# Patient Record
Sex: Male | Born: 1980 | Race: Asian | Hispanic: No | Marital: Married | Smoking: Current every day smoker
Health system: Southern US, Community
[De-identification: ages and names within clinical notes are randomized; demographics above are authoritative.]

## PROBLEM LIST (undated history)

## (undated) DIAGNOSIS — F1010 Alcohol abuse, uncomplicated: Secondary | ICD-10-CM

## (undated) DIAGNOSIS — Z789 Other specified health status: Secondary | ICD-10-CM

---

## 2015-10-20 ENCOUNTER — Inpatient Hospital Stay
Admission: EM | Admit: 2015-10-20 | Discharge: 2015-10-22 | DRG: 439 | Disposition: A | Discharge status: Home / Self Care | Payer: Medicaid Other | Attending: Internal Medicine | Admitting: Internal Medicine

## 2015-10-20 ENCOUNTER — Encounter: Payer: Self-pay | Admitting: Emergency Medicine

## 2015-10-20 ENCOUNTER — Emergency Department: Payer: Medicaid Other

## 2015-10-20 DIAGNOSIS — F10920 Alcohol use, unspecified with intoxication, uncomplicated: Secondary | ICD-10-CM

## 2015-10-20 DIAGNOSIS — K852 Alcohol induced acute pancreatitis without necrosis or infection: Principal | ICD-10-CM | POA: Diagnosis present

## 2015-10-20 DIAGNOSIS — K859 Acute pancreatitis without necrosis or infection, unspecified: Secondary | ICD-10-CM | POA: Diagnosis present

## 2015-10-20 DIAGNOSIS — Y908 Blood alcohol level of 240 mg/100 ml or more: Secondary | ICD-10-CM | POA: Diagnosis present

## 2015-10-20 DIAGNOSIS — E876 Hypokalemia: Secondary | ICD-10-CM | POA: Diagnosis present

## 2015-10-20 DIAGNOSIS — F1020 Alcohol dependence, uncomplicated: Secondary | ICD-10-CM

## 2015-10-20 DIAGNOSIS — R52 Pain, unspecified: Secondary | ICD-10-CM

## 2015-10-20 DIAGNOSIS — F10288 Alcohol dependence with other alcohol-induced disorder: Secondary | ICD-10-CM | POA: Diagnosis present

## 2015-10-20 DIAGNOSIS — F1029 Alcohol dependence with unspecified alcohol-induced disorder: Secondary | ICD-10-CM

## 2015-10-20 DIAGNOSIS — F17210 Nicotine dependence, cigarettes, uncomplicated: Secondary | ICD-10-CM | POA: Diagnosis present

## 2015-10-20 DIAGNOSIS — R1110 Vomiting, unspecified: Secondary | ICD-10-CM

## 2015-10-20 DIAGNOSIS — R1013 Epigastric pain: Secondary | ICD-10-CM | POA: Diagnosis present

## 2015-10-20 DIAGNOSIS — F10229 Alcohol dependence with intoxication, unspecified: Secondary | ICD-10-CM | POA: Diagnosis present

## 2015-10-20 HISTORY — DX: Alcohol abuse, uncomplicated: F10.10

## 2015-10-20 HISTORY — DX: Other specified health status: Z78.9

## 2015-10-20 LAB — COMPREHENSIVE METABOLIC PANEL
ALT: 35 U/L (ref 17–63)
ANION GAP: 12 (ref 5–15)
AST: 124 U/L — ABNORMAL HIGH (ref 15–41)
Albumin: 3.7 g/dL (ref 3.5–5.0)
Alkaline Phosphatase: 81 U/L (ref 38–126)
BUN: 8 mg/dL (ref 6–20)
CHLORIDE: 103 mmol/L (ref 101–111)
CO2: 25 mmol/L (ref 22–32)
CREATININE: 0.81 mg/dL (ref 0.61–1.24)
Calcium: 8.2 mg/dL — ABNORMAL LOW (ref 8.9–10.3)
Glucose, Bld: 103 mg/dL — ABNORMAL HIGH (ref 65–99)
POTASSIUM: 3 mmol/L — AB (ref 3.5–5.1)
SODIUM: 140 mmol/L (ref 135–145)
Total Bilirubin: 0.4 mg/dL (ref 0.3–1.2)
Total Protein: 8 g/dL (ref 6.5–8.1)

## 2015-10-20 LAB — CBC WITH DIFFERENTIAL/PLATELET
Basophils Absolute: 0 10*3/uL (ref 0–0.1)
Basophils Relative: 0 %
EOS ABS: 0.1 10*3/uL (ref 0–0.7)
EOS PCT: 2 %
HCT: 40.4 % (ref 40.0–52.0)
Hemoglobin: 13.5 g/dL (ref 13.0–18.0)
LYMPHS ABS: 2.3 10*3/uL (ref 1.0–3.6)
LYMPHS PCT: 39 %
MCH: 30.5 pg (ref 26.0–34.0)
MCHC: 33.5 g/dL (ref 32.0–36.0)
MCV: 91.1 fL (ref 80.0–100.0)
MONO ABS: 0.6 10*3/uL (ref 0.2–1.0)
MONOS PCT: 10 %
Neutro Abs: 2.8 10*3/uL (ref 1.4–6.5)
Neutrophils Relative %: 49 %
PLATELETS: 156 10*3/uL (ref 150–440)
RBC: 4.43 MIL/uL (ref 4.40–5.90)
RDW: 16.8 % — AB (ref 11.5–14.5)
WBC: 5.8 10*3/uL (ref 3.8–10.6)

## 2015-10-20 LAB — LIPASE, BLOOD: LIPASE: 227 U/L — AB (ref 11–51)

## 2015-10-20 LAB — MAGNESIUM: MAGNESIUM: 1.6 mg/dL — AB (ref 1.7–2.4)

## 2015-10-20 LAB — ETHANOL: ALCOHOL ETHYL (B): 419 mg/dL — AB (ref <5)

## 2015-10-20 MED ORDER — ONDANSETRON HCL 4 MG/2ML IJ SOLN: 4.0000 mg | INTRAMUSCULAR | Status: AC

## 2015-10-20 MED ORDER — HYDRALAZINE HCL 20 MG/ML IJ SOLN
10.0000 mg | Freq: Four times a day (QID) | INTRAMUSCULAR | Status: DC | PRN
  Administered 2015-10-20: 10 mg via INTRAVENOUS

## 2015-10-20 MED ORDER — ADULT MULTIVITAMIN W/MINERALS CH
1.0000 | ORAL_TABLET | Freq: Every day | ORAL | Status: DC
  Administered 2015-10-21 – 2015-10-22 (×2): 1 via ORAL
  Filled 2015-10-20 (×2): qty 1

## 2015-10-20 MED ORDER — CLONIDINE HCL 0.1 MG PO TABS: 0.1000 mg | ORAL_TABLET | Freq: Four times a day (QID) | ORAL | Status: DC | PRN

## 2015-10-20 MED ORDER — MAGNESIUM SULFATE 2 GM/50ML IV SOLN
2.0000 g | Freq: Once | INTRAVENOUS | Status: AC
  Administered 2015-10-20: 2 g via INTRAVENOUS
  Filled 2015-10-20: qty 50

## 2015-10-20 MED ORDER — POTASSIUM CHLORIDE CRYS ER 20 MEQ PO TBCR: 40.0000 meq | EXTENDED_RELEASE_TABLET | Freq: Once | ORAL | Status: DC

## 2015-10-20 MED ORDER — MORPHINE SULFATE (PF) 4 MG/ML IV SOLN: 4.0000 mg | Freq: Once | INTRAVENOUS | Status: DC

## 2015-10-20 MED ORDER — MORPHINE SULFATE (PF) 4 MG/ML IV SOLN: 4.0000 mg | INTRAVENOUS | Status: DC | PRN

## 2015-10-20 MED ORDER — DIAZEPAM 5 MG PO TABS
ORAL_TABLET | ORAL | Status: AC
  Filled 2015-10-20: qty 1

## 2015-10-20 MED ORDER — POTASSIUM CHLORIDE IN NACL 40-0.9 MEQ/L-% IV SOLN
INTRAVENOUS | Status: AC
  Administered 2015-10-20: 125 mL/h via INTRAVENOUS
  Filled 2015-10-20: qty 1000

## 2015-10-20 MED ORDER — ONDANSETRON HCL 4 MG/2ML IJ SOLN: 4.0000 mg | Freq: Four times a day (QID) | INTRAMUSCULAR | Status: DC | PRN

## 2015-10-20 MED ORDER — ENOXAPARIN SODIUM 40 MG/0.4ML ~~LOC~~ SOLN
40.0000 mg | SUBCUTANEOUS | Status: DC
  Administered 2015-10-20 – 2015-10-21 (×2): 40 mg via SUBCUTANEOUS
  Filled 2015-10-20 (×2): qty 0.4

## 2015-10-20 MED ORDER — LORAZEPAM 2 MG/ML IJ SOLN: 2.0000 mg | INTRAMUSCULAR | Status: DC | PRN

## 2015-10-20 MED ORDER — THIAMINE HCL 100 MG/ML IJ SOLN: 100.0000 mg | Freq: Every day | INTRAMUSCULAR | Status: DC

## 2015-10-20 MED ORDER — MORPHINE SULFATE (PF) 2 MG/ML IV SOLN: 2.0000 mg | INTRAVENOUS | Status: DC | PRN

## 2015-10-20 MED ORDER — POTASSIUM CHLORIDE 10 MEQ/100ML IV SOLN
10.0000 meq | INTRAVENOUS | Status: AC
Start: 2015-10-20 — End: 2015-10-20
  Administered 2015-10-20 (×5): 10 meq via INTRAVENOUS
  Filled 2015-10-20 (×6): qty 100

## 2015-10-20 MED ORDER — VITAMIN B-1 100 MG PO TABS
100.0000 mg | ORAL_TABLET | Freq: Every day | ORAL | Status: DC
  Administered 2015-10-21 – 2015-10-22 (×2): 100 mg via ORAL
  Filled 2015-10-20 (×2): qty 1

## 2015-10-20 MED ORDER — MAGNESIUM SULFATE 4 GM/100ML IV SOLN
4.0000 g | Freq: Once | INTRAVENOUS | Status: DC
  Filled 2015-10-20: qty 100

## 2015-10-20 MED ORDER — HYDRALAZINE HCL 20 MG/ML IJ SOLN
INTRAMUSCULAR | Status: AC
  Filled 2015-10-20: qty 1

## 2015-10-20 MED ORDER — FOLIC ACID 1 MG PO TABS
1.0000 mg | ORAL_TABLET | Freq: Every day | ORAL | Status: DC
  Administered 2015-10-21 – 2015-10-22 (×2): 1 mg via ORAL
  Filled 2015-10-20 (×2): qty 1

## 2015-10-20 MED ORDER — VITAMIN B-1 100 MG PO TABS: 100.0000 mg | ORAL_TABLET | Freq: Every day | ORAL | Status: DC

## 2015-10-20 MED ORDER — THIAMINE HCL 100 MG/ML IJ SOLN
INTRAVENOUS | Status: AC
  Administered 2015-10-20: 08:00:00 via INTRAVENOUS
  Filled 2015-10-20: qty 1000

## 2015-10-20 MED ORDER — POTASSIUM CHLORIDE IN NACL 20-0.9 MEQ/L-% IV SOLN
INTRAVENOUS | Status: DC
  Administered 2015-10-20 – 2015-10-22 (×4): via INTRAVENOUS
  Filled 2015-10-20 (×7): qty 1000

## 2015-10-20 MED ORDER — LORAZEPAM 2 MG PO TABS: 2.0000 mg | ORAL_TABLET | ORAL | Status: DC | PRN

## 2015-10-20 MED ORDER — DIAZEPAM 5 MG PO TABS
5.0000 mg | ORAL_TABLET | Freq: Two times a day (BID) | ORAL | Status: DC
  Administered 2015-10-20 – 2015-10-22 (×5): 5 mg via ORAL
  Filled 2015-10-20 (×4): qty 1

## 2015-10-20 NOTE — ED Triage Notes (Addendum)
Patient presents to Emergency Department via EMS with complaints of wanting ETOH detox.  Pt reports drinking 1 bottle of [employer] every day for the last 2 years -- approx.  Pt reports weight loss and family problems since starting daily drinking.    Pt first language is Clydie BraunKaren and [place] arriving approximately 6 or 7 years ago.

## 2015-10-20 NOTE — ED Notes (Addendum)
CRITICAL LAB: ETOH is 419, [facility], Dr. York CeriseForbach notified, orders recieved

## 2015-10-20 NOTE — Progress Notes (Signed)
Pt has hard, fixed knot on upper left chest. Pt complains that it is painful with palpation and with breathing. MD notified, no orders at this time.

## 2015-10-20 NOTE — ED Provider Notes (Signed)
[facility] Emergency Department Provider Note  ____________________________________________   First MD Initiated Contact with Patient 10/20/15 0410     (approximate)  I have reviewed the triage vital signs and the nursing notes.   HISTORY  Chief Complaint Alcohol Problem  The patient is acutely intoxicated and limits his ability to give a coherent history.  English is also not his first language.  HPI Stanley Rodgers is a 35 y.o. male who presents requesting detox.He reports that he drinks a bottle of brandy every day and has done so for the last several years.  He also reports that he has had pancreatitis in the past and is complaining of some epigastric pain that feels similar to prior pancreatitis.  He states that he has had some vomiting.  He denies fever/chills, chest pain, shortness of breath.   Past Medical History:  Diagnosis Date  . Alcohol abuse     There are no active problems to display for this patient.   No past surgical history on file.  Prior to Admission medications   Not on File    Allergies Review of patient's allergies indicates no known allergies.  No family history on file.  Social History Social History  Substance Use Topics  . Smoking status: Current Every Day Smoker    Packs/day: 1.00    Types: Cigarettes  . Smokeless tobacco: Never Used  . Alcohol use Not on file    Review of Systems Constitutional: No fever/chills Eyes: No visual changes. ENT: No sore throat. Cardiovascular: Denies chest pain. Respiratory: Denies shortness of breath. Gastrointestinal: Epigastric abdominal pain.  Occasional nausea and vomiting recently.  No diarrhea.  No constipation. Genitourinary: Negative for dysuria. Musculoskeletal: Negative for back pain. Skin: Negative for rash. Neurological: Negative for headaches, focal weakness or numbness.  10-point ROS otherwise  negative.  ____________________________________________   PHYSICAL EXAM:  VITAL SIGNS: ED Triage Vitals  Enc Vitals Group     BP 10/20/15 0106 (!) 161/118     Pulse Rate 10/20/15 0106 84     Resp 10/20/15 0550 15     Temp 10/20/15 0107 97.9 F (36.6 C)     Temp Source 10/20/15 0107 Oral     SpO2 10/20/15 0106 100 %     Weight 10/20/15 0125 150 lb (68 kg)     Height 10/20/15 0125 5\' 10"  (1.778 m)     Head Circumference --      Peak Flow --      Pain Score --      Pain Loc --      Pain Edu? --      Excl. in GC? --     Constitutional: Alert and oriented. Well appearing and in no acute distress. Does appear clinically intoxicated Eyes: Conjunctivae are normal. PERRL. EOMI. Head: Atraumatic. Nose: No congestion/rhinnorhea. Mouth/Throat: Mucous membranes are moist.  Oropharynx non-erythematous. Neck: No stridor.  No meningeal signs.   Cardiovascular: Normal rate, regular rhythm. Good peripheral circulation. Grossly normal heart sounds. Respiratory: Normal respiratory effort.  No retractions. Lungs CTAB. Gastrointestinal: Soft with tenderness to palpation of the epigastrium. Musculoskeletal: No lower extremity tenderness nor edema. No gross deformities of extremities. Neurologic:  Normal speech and language. No gross focal neurologic deficits are appreciated.  Skin:  Skin is warm, dry and intact. No rash noted. Psychiatric: Mood and affect are normal. Speech and behavior are normal.  ____________________________________________   LABS (all labs ordered are listed, but only abnormal results are displayed)  Labs Reviewed  ETHANOL - Abnormal; Notable for the following:       Result Value   Alcohol, Ethyl (B) 419 (*)    All other components within normal limits  LIPASE, BLOOD - Abnormal; Notable for the following:    Lipase 227 (*)    All other components within normal limits  CBC WITH DIFFERENTIAL/PLATELET - Abnormal; Notable for the following:    RDW 16.8 (*)    All  other components within normal limits  COMPREHENSIVE METABOLIC PANEL - Abnormal; Notable for the following:    Potassium 3.0 (*)    Glucose, Bld 103 (*)    Calcium 8.2 (*)    AST 124 (*)    All other components within normal limits   ____________________________________________  EKG  None - EKG not ordered by ED physician ____________________________________________  RADIOLOGY   No results found.  ____________________________________________   PROCEDURES  Procedure(s) performed:   Procedures   Critical Care performed: No ____________________________________________   INITIAL IMPRESSION / ASSESSMENT AND PLAN / ED COURSE  Pertinent labs & imaging results that were available during my care of the patient were reviewed by me and considered in my medical decision making (see chart for details).  The patient first arrived we will check ethanol level which was extremely elevated at 419.  He is placed on CIWA.  When I finally had the opportunity to assess him for the first time he was fast asleep but did awaken when I shook his shoulders.  Once he was awake he told me about the history of pancreatitis in the presence of the epigastric pain which she had not told his nurse upon arrival.  We will check basic labs including lipase, metabolic panel, and CBC and reassess.  He was too intoxicated last night to [facility] if he is able to be cleared medically and they Kjell a bed for him he would be appropriate.  He denies a history of complicated withdrawal.   Clinical Course  Value Comment By Time  Lipase: (!) 227 The patient's lipase is elevated at 227 and he has a potassium of 3.0.  All this is consistent with mild pancreatitis but the rest of his workup is reassuring.  I will bring him into the hospital given his abdominal tenderness, multiple episodes of vomiting, and his severe alcohol abuse. Loleta Roseory Deionte Spivack, MD 10/14 [phone]     ____________________________________________  FINAL CLINICAL IMPRESSION(S) / ED DIAGNOSES  Final diagnoses:  Alcohol-induced acute pancreatitis without infection or necrosis  Alcoholic intoxication without complication (HCC)  Alcohol dependence with unspecified alcohol-induced disorder (HCC)  Hypokalemia     MEDICATIONS GIVEN DURING THIS VISIT:  Medications  sodium chloride 0.9 % 1,000 mL with thiamine 100 mg, folic acid 1 mg, multivitamins adult 10 mL infusion (not administered)  ondansetron (ZOFRAN) injection 4 mg (not administered)  morphine 4 MG/ML injection 4 mg (not administered)  potassium chloride 10 mEq in 100 mL IVPB (not administered)     NEW OUTPATIENT MEDICATIONS STARTED DURING THIS VISIT:  New Prescriptions   No medications on file    Modified Medications   No medications on file    Discontinued Medications   No medications on file     Note:  This document was prepared using Dragon voice recognition software and may include unintentional dictation errors.    Loleta Roseory Masashi Snowdon, MD 10/20/15 [phone]

## 2015-10-20 NOTE — ED Notes (Signed)
Spoke with vachhani about pts diastolic. He will order bp med.

## 2015-10-20 NOTE — ED Notes (Signed)
Per NA pt told xray that he could not stand but got back in room and got out of bed and was drinking water from sink after RN told him not to eat or drink prior to xray. Explained again not to eat or drink until told it was ok per dr.

## 2015-10-20 NOTE — H&P (Signed)
[facility] - [place] at [facility]   PATIENT NAME: Stanley Rodgers    MR#:  [id number]  DATE OF BIRTH:  1980/09/27  DATE OF ADMISSION:  10/20/2015  PRIMARY CARE PHYSICIAN: none  REQUESTING/REFERRING PHYSICIAN: Dr. York Cerise  CHIEF COMPLAINT:   Chief Complaint  Patient presents with  . Alcohol Problem    HISTORY OF PRESENT ILLNESS: Jahaan Mamaril  is a 35 y.o. male with a known history of No known medical issues, no PMD- chronic alcoholic- drinks 1 bottle of wine daily. Having on and off pain in epigastric region, now for few days started more pain and vomiting. Came to ER as he want to quit drinking. Lipase elevated, K low and alcohol level is > 400.  PAST MEDICAL HISTORY:   Past Medical History:  Diagnosis Date  . Alcohol abuse   . Medical history non-contributory     PAST SURGICAL HISTORY: No past surgical history on file.  SOCIAL HISTORY:  Social History  Substance Use Topics  . Smoking status: Current Every Day Smoker    Packs/day: 1.00    Types: Cigarettes  . Smokeless tobacco: Never Used  . Alcohol use  1 bottle of wine daily.    FAMILY HISTORY:  Family History  Problem Relation Age of Onset  . Family history unknown: Yes   not in well contact of family.  DRUG ALLERGIES: No Known Allergies  REVIEW OF SYSTEMS:   CONSTITUTIONAL: No fever, fatigue or weakness.  EYES: No blurred or double vision.  EARS, NOSE, AND THROAT: No tinnitus or ear pain.  RESPIRATORY: No cough, shortness of breath, wheezing or hemoptysis.  CARDIOVASCULAR: No chest pain, orthopnea, edema.  GASTROINTESTINAL: positive for nausea, vomiting, no diarrhea , have abdominal pain.  GENITOURINARY: No dysuria, hematuria.  ENDOCRINE: No polyuria, nocturia,  HEMATOLOGY: No anemia, easy bruising or bleeding SKIN: No rash or lesion. MUSCULOSKELETAL: No joint pain or arthritis.   NEUROLOGIC: No tingling, numbness, weakness.  PSYCHIATRY: No anxiety or depression.   MEDICATIONS AT HOME:   Prior to Admission medications   Not on File      PHYSICAL EXAMINATION:   VITAL SIGNS: Blood pressure (!) 142/103, pulse 66, temperature 97.8 F (36.6 C), temperature source Oral, resp. rate 15, height 5\' 10"  (1.778 m), weight 68 kg (150 lb), SpO2 100 %.  GENERAL:  35 y.o.-year-old patient lying in the bed with no acute distress.  EYES: Pupils equal, round, reactive to light and accommodation. No scleral icterus. Extraocular muscles intact.  HEENT: Head atraumatic, normocephalic. Oropharynx and nasopharynx clear.  NECK:  Supple, no jugular venous distention. No thyroid enlargement, no tenderness.  LUNGS: Normal breath sounds bilaterally, no wheezing, rales,rhonchi or crepitation. No use of accessory muscles of respiration. On left upper chest- a small bump in his rib , which is tender to pressure. CARDIOVASCULAR: S1, S2 normal. No murmurs, rubs, or gallops.  ABDOMEN: Soft, epigastric tender, nondistended. Bowel sounds present. No organomegaly or mass.  EXTREMITIES: No pedal edema, cyanosis, or clubbing.  NEUROLOGIC: Cranial nerves II through XII are intact. Muscle strength 5/5 in all extremities. Sensation intact. Gait not checked. No tremers. PSYCHIATRIC: The patient is alert and oriented x 3.  SKIN: No obvious rash, lesion, or ulcer.   LABORATORY PANEL:   CBC  Recent Labs Lab 10/20/15 0120  WBC 5.8  HGB 13.5  HCT 40.4  PLT 156  MCV 91.1  MCH 30.5  MCHC 33.5  RDW 16.8*  LYMPHSABS 2.3  MONOABS 0.6  EOSABS 0.1  BASOSABS  0.0   ------------------------------------------------------------------------------------------------------------------  Chemistries   Recent Labs Lab 10/20/15 0120  NA 140  K 3.0*  CL 103  CO2 25  GLUCOSE 103*  BUN 8  CREATININE 0.81  CALCIUM 8.2*  AST 124*  ALT 35  ALKPHOS 81  BILITOT 0.4   ------------------------------------------------------------------------------------------------------------------ estimated creatinine clearance  is 123.6 mL/min (by C-G formula based on SCr of 0.81 mg/dL). ------------------------------------------------------------------------------------------------------------------ No results for input(s): TSH, T4TOTAL, T3FREE, THYROIDAB in the last 72 hours.  Invalid input(s): FREET3   Coagulation profile No results for input(s): INR, PROTIME in the last 168 hours. ------------------------------------------------------------------------------------------------------------------- No results for input(s): DDIMER in the last 72 hours. -------------------------------------------------------------------------------------------------------------------  Cardiac Enzymes No results for input(s): CKMB, TROPONINI, MYOGLOBIN in the last 168 hours.  Invalid input(s): CK ------------------------------------------------------------------------------------------------------------------ Invalid input(s): POCBNP  ---------------------------------------------------------------------------------------------------------------  Urinalysis No results found for: COLORURINE, APPEARANCEUR, LABSPEC, PHURINE, GLUCOSEU, HGBUR, BILIRUBINUR, KETONESUR, PROTEINUR, UROBILINOGEN, NITRITE, LEUKOCYTESUR   RADIOLOGY: No results found.  EKG: No orders found for this or any previous visit.  IMPRESSION AND PLAN:  * Acute pancreatitis   NPO except meds   IV fluids, Vitamin supplements   IV nausea and pain meds PRN.   Follow Lipase and LFT.  * Chronic alcoholism   High risk for withdrawal symptoms   On CIWA protocol.   Also added oral Valium.  * Hypokalemia   Check Mg   Replace with iv fluid.  * tobacco abuse   Councelled to quit smoking for 4 min.  * pain on left chest on rib   Xray chest.   All the records are reviewed and case discussed with ED provider. Management plans discussed with the patient, family and they are in agreement.  CODE STATUS: Full. Code Status History    This patient does not  have a recorded code status. Please follow your organizational policy for patients in this situation.      TOTAL TIME TAKING CARE OF THIS PATIENT: 50 minutes.    Altamese DillingVACHHANI, Cimberly Stoffel M.D on 10/20/2015   Between 7am to 6pm - Pager - [phone]  After 6pm go to [email or web address] - Social research officer, governmentpassword [email or web address]  [facility]  Office  [phone]  CC: Primary care physician; No PCP Per Patient   Note: This dictation was prepared with Dragon dictation along with smaller phrase technology. Any transcriptional errors that result from this process are unintentional.

## 2015-10-21 LAB — COMPREHENSIVE METABOLIC PANEL
ALBUMIN: 3.4 g/dL — AB (ref 3.5–5.0)
ALT: 56 U/L (ref 17–63)
AST: 263 U/L — AB (ref 15–41)
Alkaline Phosphatase: 99 U/L (ref 38–126)
Anion gap: 13 (ref 5–15)
BILIRUBIN TOTAL: 1.6 mg/dL — AB (ref 0.3–1.2)
BUN: 11 mg/dL (ref 6–20)
CO2: 18 mmol/L — ABNORMAL LOW (ref 22–32)
Calcium: 8.2 mg/dL — ABNORMAL LOW (ref 8.9–10.3)
Chloride: 104 mmol/L (ref 101–111)
Creatinine, Ser: 0.74 mg/dL (ref 0.61–1.24)
GFR calc Af Amer: >60 mL/min (ref >60)
GFR calc non Af Amer: >60 mL/min (ref >60)
GLUCOSE: 69 mg/dL (ref 65–99)
POTASSIUM: 4.3 mmol/L (ref 3.5–5.1)
Sodium: 135 mmol/L (ref 135–145)
TOTAL PROTEIN: 7.2 g/dL (ref 6.5–8.1)

## 2015-10-21 LAB — LIPASE, BLOOD: Lipase: 67 U/L — ABNORMAL HIGH (ref 11–51)

## 2015-10-21 LAB — CBC
HEMATOCRIT: 37.7 % — AB (ref 40.0–52.0)
HEMOGLOBIN: 12.7 g/dL — AB (ref 13.0–18.0)
MCH: 30.6 pg (ref 26.0–34.0)
MCHC: 33.6 g/dL (ref 32.0–36.0)
MCV: 91.2 fL (ref 80.0–100.0)
Platelets: 112 10*3/uL — ABNORMAL LOW (ref 150–440)
RBC: 4.14 MIL/uL — AB (ref 4.40–5.90)
RDW: 16.9 % — AB (ref 11.5–14.5)
WBC: 4.3 10*3/uL (ref 3.8–10.6)

## 2015-10-21 NOTE — Progress Notes (Signed)
[facility] - [place] at [facility]   PATIENT NAME: Stanley Rodgers    MR#:  [id number]  DATE OF BIRTH:  1980-08-25  SUBJECTIVE:  CHIEF COMPLAINT:   Chief Complaint  Patient presents with  . Alcohol Problem    Came with alcohol abuse, nausea and abdominal pain- have acute pancreatitis, on IV fluids.  Mild signs of withdrawal today, no nausea , ready for trying liquid diet.  REVIEW OF SYSTEMS:   CONSTITUTIONAL: No fever, fatigue or weakness.  EYES: No blurred or double vision.  EARS, NOSE, AND THROAT: No tinnitus or ear pain.  RESPIRATORY: No cough, shortness of breath, wheezing or hemoptysis.  CARDIOVASCULAR: No chest pain, orthopnea, edema.  GASTROINTESTINAL: No nausea, vomiting, diarrhea or abdominal pain.  GENITOURINARY: No dysuria, hematuria.  ENDOCRINE: No polyuria, nocturia,  HEMATOLOGY: No anemia, easy bruising or bleeding SKIN: No rash or lesion. MUSCULOSKELETAL: No joint pain or arthritis.   NEUROLOGIC: No tingling, numbness, weakness.  PSYCHIATRY: No anxiety or depression.  ROS  DRUG ALLERGIES:  No Known Allergies  VITALS:  Blood pressure 138/77, pulse 78, temperature 98.2 F (36.8 C), temperature source Oral, resp. rate 20, height 5\' 10"  (1.778 m), weight 65.1 kg (143 lb 8 oz), SpO2 100 %.  PHYSICAL EXAMINATION:   GENERAL:  35 y.o.-year-old patient lying in the bed with no acute distress.  EYES: Pupils equal, round, reactive to light and accommodation. No scleral icterus. Extraocular muscles intact.  HEENT: Head atraumatic, normocephalic. Oropharynx and nasopharynx clear.  NECK:  Supple, no jugular venous distention. No thyroid enlargement, no tenderness.  LUNGS: Normal breath sounds bilaterally, no wheezing, rales,rhonchi or crepitation. No use of accessory muscles of respiration.  CARDIOVASCULAR: S1, S2 normal. No murmurs, rubs, or gallops.  ABDOMEN: Soft, epigastric tender, nondistended. Bowel sounds present. No organomegaly or mass.   EXTREMITIES: No pedal edema, cyanosis, or clubbing.  NEUROLOGIC: Cranial nerves II through XII are intact. Muscle strength 5/5 in all extremities. Sensation intact. Gait not checked. Mild tremers. PSYCHIATRIC: The patient is alert and oriented x 3.  SKIN: No obvious rash, lesion, or ulcer.   Physical Exam LABORATORY PANEL:   CBC  Recent Labs Lab 10/21/15 0448  WBC 4.3  HGB 12.7*  HCT 37.7*  PLT 112*   ------------------------------------------------------------------------------------------------------------------  Chemistries   Recent Labs Lab 10/20/15 0120 10/21/15 0448  NA 140 135  K 3.0* 4.3  CL 103 104  CO2 25 18*  GLUCOSE 103* 69  BUN 8 11  CREATININE 0.81 0.74  CALCIUM 8.2* 8.2*  MG 1.6*  --   AST 124* 263*  ALT 35 56  ALKPHOS 81 99  BILITOT 0.4 1.6*   ------------------------------------------------------------------------------------------------------------------  Cardiac Enzymes No results for input(s): TROPONINI in the last 168 hours. ------------------------------------------------------------------------------------------------------------------  RADIOLOGY:  Dg Chest 2 View  Result Date: 10/20/2015 CLINICAL DATA:  Admission for detox. EXAM: CHEST  2 VIEW COMPARISON:  None. FINDINGS: The heart size and mediastinal contours are within normal limits. Both lungs are clear. The visualized skeletal structures are unremarkable. IMPRESSION: No active cardiopulmonary disease. Electronically Signed   By: Kennith Center M.D.   On: 10/20/2015 08:23    ASSESSMENT AND PLAN:   Principal Problem:   Alcohol dependence (HCC) Active Problems:   Vomiting   Hypokalemia   Acute pancreatitis  * Acute pancreatitis   NPO except meds   IV fluids, Vitamin supplements   IV nausea and pain meds PRN.   Follow Lipase and LFT.   Give liquid diet now.  *  Chronic alcoholism   High risk for withdrawal symptoms   On CIWA protocol.   Also added oral Valium.    Alcohol level was > 400 on admission.  * Hypokalemia   Check Mg   Replace with iv fluid.  * tobacco abuse   Councelled to quit smoking for 4 min.  * pain on left chest on rib   Xray chest without any boney abnormalities.    All the records are reviewed and case discussed with Care Management/Social Workerr. Management plans discussed with the patient, family and they are in agreement.  CODE STATUS: full.  TOTAL TIME TAKING CARE OF THIS PATIENT: 35 minutes.     POSSIBLE D/C IN 2 DAYS, DEPENDING ON CLINICAL CONDITION.   Altamese DillingVACHHANI, Carden Teel M.D on 10/21/2015   Between 7am to 6pm - Pager - [phone]  After 6pm go to [email or web address] - Social research officer, governmentpassword [email or web address]  [facility]  Office  [phone]  CC: Primary care physician; No PCP Per Patient  Note: This dictation was prepared with Dragon dictation along with smaller phrase technology. Any transcriptional errors that result from this process are unintentional.

## 2015-10-22 LAB — COMPREHENSIVE METABOLIC PANEL
ALT: 39 U/L (ref 17–63)
ANION GAP: 7 (ref 5–15)
AST: 101 U/L — ABNORMAL HIGH (ref 15–41)
Albumin: 3.2 g/dL — ABNORMAL LOW (ref 3.5–5.0)
Alkaline Phosphatase: 87 U/L (ref 38–126)
BUN: 6 mg/dL (ref 6–20)
CALCIUM: 8.6 mg/dL — AB (ref 8.9–10.3)
CHLORIDE: 103 mmol/L (ref 101–111)
CO2: 22 mmol/L (ref 22–32)
Creatinine, Ser: 0.68 mg/dL (ref 0.61–1.24)
GFR calc non Af Amer: >60 mL/min (ref >60)
Glucose, Bld: 86 mg/dL (ref 65–99)
Potassium: 3.6 mmol/L (ref 3.5–5.1)
SODIUM: 132 mmol/L — AB (ref 135–145)
Total Bilirubin: 1.3 mg/dL — ABNORMAL HIGH (ref 0.3–1.2)
Total Protein: 7 g/dL (ref 6.5–8.1)

## 2015-10-22 MED ORDER — FOLIC ACID 1 MG PO TABS: 1.0000 mg | ORAL_TABLET | Freq: Every day | ORAL | 0 refills | Status: AC

## 2015-10-22 MED ORDER — LORAZEPAM 1 MG PO TABS: 1.0000 mg | ORAL_TABLET | Freq: Three times a day (TID) | ORAL | 0 refills | Status: AC | PRN

## 2015-10-22 MED ORDER — THIAMINE HCL 100 MG PO TABS: 100.0000 mg | ORAL_TABLET | Freq: Every day | ORAL | 0 refills | Status: AC

## 2015-10-22 MED ORDER — ADULT MULTIVITAMIN W/MINERALS CH: 1.0000 | ORAL_TABLET | Freq: Every day | ORAL | 0 refills | Status: AC

## 2015-10-22 MED ORDER — DIAZEPAM 5 MG PO TABS: ORAL_TABLET | ORAL | 0 refills | Status: AC

## 2015-10-22 NOTE — Discharge Summary (Signed)
[facility] - [place] at [facility]   PATIENT NAME: Khaleem Burchill    MR#:  [id number]  DATE OF BIRTH:  Jun 25, 1980  DATE OF ADMISSION:  10/20/2015 ADMITTING PHYSICIAN: Altamese Dilling, MD  DATE OF DISCHARGE: 10/22/2015  PRIMARY CARE PHYSICIAN: No PCP Per Patient    ADMISSION DIAGNOSIS:  Hypokalemia [E87.6] Pain [R52] Alcoholic intoxication without complication (HCC) [F10.920] Alcohol dependence with unspecified alcohol-induced disorder (HCC) [F10.29] Alcohol-induced acute pancreatitis without infection or necrosis [K85.20]  DISCHARGE DIAGNOSIS:  Principal Problem:   Alcohol dependence (HCC) Active Problems:   Vomiting   Hypokalemia   Acute pancreatitis   SECONDARY DIAGNOSIS:   Past Medical History:  Diagnosis Date  . Alcohol abuse   . Medical history non-contributory     HOSPITAL COURSE:   * Acute pancreatitis NPO except meds IV fluids, Vitamin supplements IV nausea and pain meds PRN. Follow Lipase and LFT.   Give liquid diet now.   Tolerated well, so upgrade to regular diet.  * Chronic alcoholism High risk for withdrawal symptoms On CIWA protocol. Also added oral Valium.   Alcohol level was > 400 on admission.   Have some tremers but overall symptoms controlled by only oral valium, and did not require IV or oral ativan, so will discharge home with oral valium and PRN oral ativan.  * Hypokalemia Check Mg Replace with iv fluid.  * tobacco abuse Councelled to quit smoking for 4 min.  * pain on left chest on rib Xray chest without any boney abnormalities.  DISCHARGE CONDITIONS:   Stable.  CONSULTS OBTAINED:    DRUG ALLERGIES:  No Known Allergies  DISCHARGE MEDICATIONS:   Current Discharge Medication List    START taking these medications   Details  diazepam (VALIUM) 5 MG tablet Take one tablet twice daily by mouth for next 3 days, then take 0.5 tablet ( 2.5 mg) oral twice daily for next 2  days, then stop. Qty: 8 tablet, Refills: 0    folic acid (FOLVITE) 1 MG tablet Take 1 tablet (1 mg total) by mouth daily. Qty: 30 tablet, Refills: 0    LORazepam (ATIVAN) 1 MG tablet Take 1 tablet (1 mg total) by mouth every 8 (eight) hours as needed for anxiety. Do not take more than 3 times a day. Do not take within 1 hour of taking valium. Qty: 15 tablet, Refills: 0    Multiple Vitamin (MULTIVITAMIN WITH MINERALS) TABS tablet Take 1 tablet by mouth daily. Qty: 30 tablet, Refills: 0    thiamine 100 MG tablet Take 1 tablet (100 mg total) by mouth daily. Qty: 30 tablet, Refills: 0         DISCHARGE INSTRUCTIONS:    Follow with PCP in 2-3 weeks.  If you experience worsening of your admission symptoms, develop shortness of breath, life threatening emergency, suicidal or homicidal thoughts you must seek medical attention immediately by calling 911 or calling your MD immediately  if symptoms less severe.  You Must read complete instructions/literature along with all the possible adverse reactions/side effects for all the Medicines you take and that have been prescribed to you. Take any new Medicines after you have completely understood and accept all the possible adverse reactions/side effects.   Please note  You were cared for by a hospitalist during your hospital stay. If you have any questions about your discharge medications or the care you received while you were in the hospital after you are discharged, you can call the unit and asked to  speak with the hospitalist on call if the hospitalist that took care of you is not available. Once you are discharged, your primary care physician will handle any further medical issues. Please note that NO REFILLS for any discharge medications will be authorized once you are discharged, as it is imperative that you return to your primary care physician (or establish a relationship with a primary care physician if you do not have one) for your  aftercare needs so that they can reassess your need for medications and monitor your lab values.    Today   CHIEF COMPLAINT:   Chief Complaint  Patient presents with  . Alcohol Problem    HISTORY OF PRESENT ILLNESS:  Avigdor Hardie PulleyKleh  is a 35 y.o. male with a known history of No known medical issues, no PMD- chronic alcoholic- drinks 1 bottle of wine daily. Having on and off pain in epigastric region, now for few days started more pain and vomiting. Came to ER as he want to quit drinking. Lipase elevated, K low and alcohol level is > 400.   VITAL SIGNS:  Blood pressure (!) 141/90, pulse 72, temperature 98.7 F (37.1 C), temperature source Oral, resp. rate 19, height 5\' 10"  (1.778 m), weight 65.1 kg (143 lb 8 oz), SpO2 100 %.  I/O:   Intake/Output Summary (Last 24 hours) at 10/22/15 0942 Last data filed at 10/22/15 0900  Gross per 24 hour  Intake           4992.5 ml  Output                0 ml  Net           4992.5 ml    PHYSICAL EXAMINATION:  GENERAL:  35 y.o.-year-old patient lying in the bed with no acute distress.  EYES: Pupils equal, round, reactive to light and accommodation. No scleral icterus. Extraocular muscles intact.  HEENT: Head atraumatic, normocephalic. Oropharynx and nasopharynx clear.  NECK:  Supple, no jugular venous distention. No thyroid enlargement, no tenderness.  LUNGS: Normal breath sounds bilaterally, no wheezing, rales,rhonchi or crepitation. No use of accessory muscles of respiration.  CARDIOVASCULAR: S1, S2 normal. No murmurs, rubs, or gallops.  ABDOMEN: Soft, non-tender, non-distended. Bowel sounds present. No organomegaly or mass.  EXTREMITIES: No pedal edema, cyanosis, or clubbing.  NEUROLOGIC: Cranial nerves II through XII are intact. Muscle strength 5/5 in all extremities. Sensation intact. Gait not checked. Mild shaking on hands. PSYCHIATRIC: The patient is alert and oriented x 3.  SKIN: No obvious rash, lesion, or ulcer.   DATA REVIEW:    CBC  Recent Labs Lab 10/21/15 0448  WBC 4.3  HGB 12.7*  HCT 37.7*  PLT 112*    Chemistries   Recent Labs Lab 10/20/15 0120  10/22/15 0344  NA 140  < > 132*  K 3.0*  < > 3.6  CL 103  < > 103  CO2 25  < > 22  GLUCOSE 103*  < > 86  BUN 8  < > 6  CREATININE 0.81  < > 0.68  CALCIUM 8.2*  < > 8.6*  MG 1.6*  --   --   AST 124*  < > 101*  ALT 35  < > 39  ALKPHOS 81  < > 87  BILITOT 0.4  < > 1.3*  < > = values in this interval not displayed.  Cardiac Enzymes No results for input(s): TROPONINI in the last 168 hours.  Microbiology Results  No results found  for this or any previous visit.  RADIOLOGY:  No results found.  EKG:  No orders found for this or any previous visit.    Management plans discussed with the patient, family and they are in agreement.  CODE STATUS:     Code Status Orders        Start     Ordered   10/20/15 0916  Full code  Continuous     10/20/15 0915    Code Status History    Date Active Date Inactive Code Status Order ID Comments User Context   This patient has a current code status but no historical code status.      TOTAL TIME TAKING CARE OF THIS PATIENT: 35 minutes.    Altamese Dilling M.D on 10/22/2015 at 9:42 AM  Between 7am to 6pm - Pager - [phone]  After 6pm go to [email or web address] - Social research officer, government  [facility]  Office  [phone]  CC: Primary care physician; No PCP Per Patient   Note: This dictation was prepared with Dragon dictation along with smaller phrase technology. Any transcriptional errors that result from this process are unintentional.

## 2015-10-22 NOTE — Progress Notes (Signed)
Requested interpretor to be with patient to review discharge paperwork but patient refused stating "i'm ready to leave". Discharge paperwork reviewed with patient who verbalized understanding. Paper prescriptions given to patient. Patient escorted by this RN to main visitor's entrance. Patient's ride not there. Patient called wife who stated she is picking up their kids would be 30 minutes. I explained patient can come back to room to wait for her to arrive but patient refused to come back inside hospital. Patient waiting on bench with discharge paperwork and cell phone in hand, no other belongings in room at time of discharge.

## 2017-06-20 IMAGING — CR DG CHEST 2V
1 series · 2 of 2 positions shown · non-contrast
Comparison: None.

CLINICAL DATA: Admission for detox.

EXAM:
CHEST  2 VIEW

[Series 1: dg chest 2 view · 0.14mm/px · 2 of 2 slices shown]
[im 1/2]
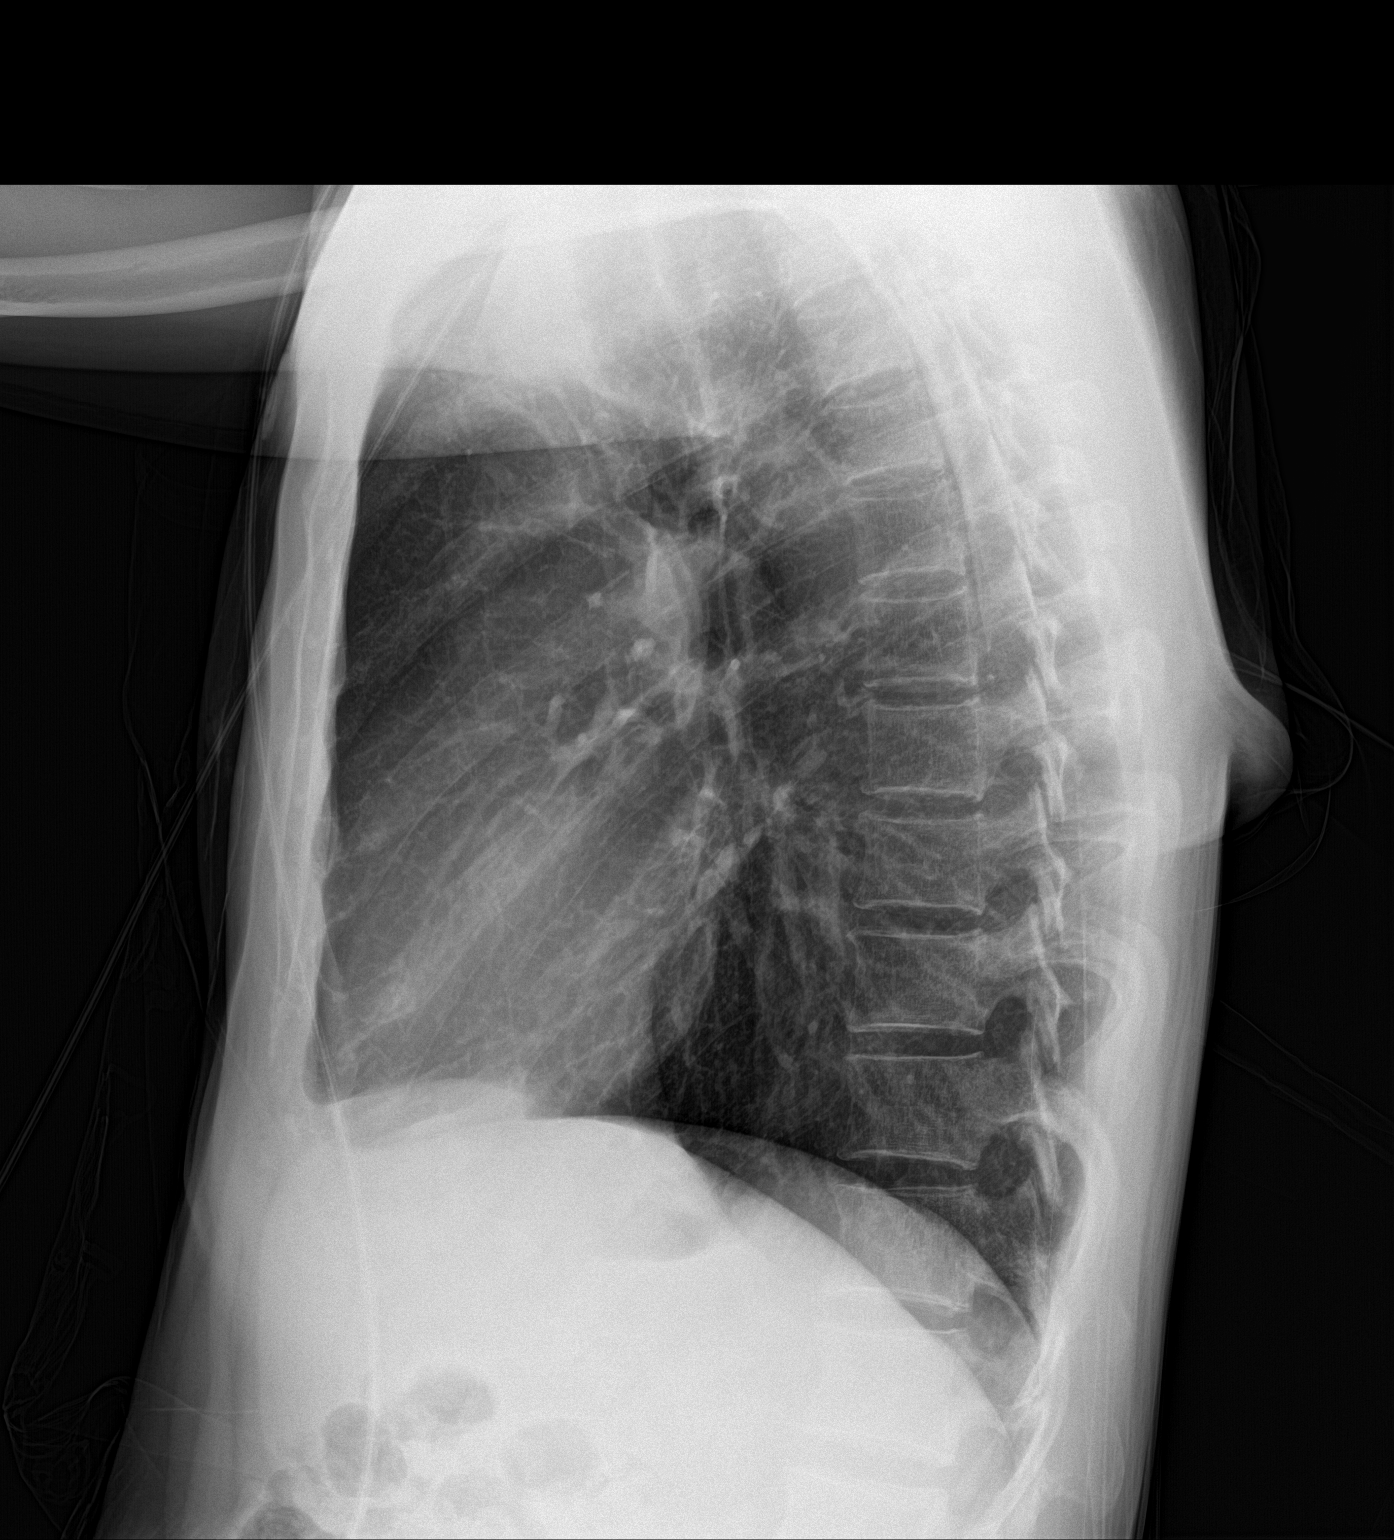
[im 2/2]
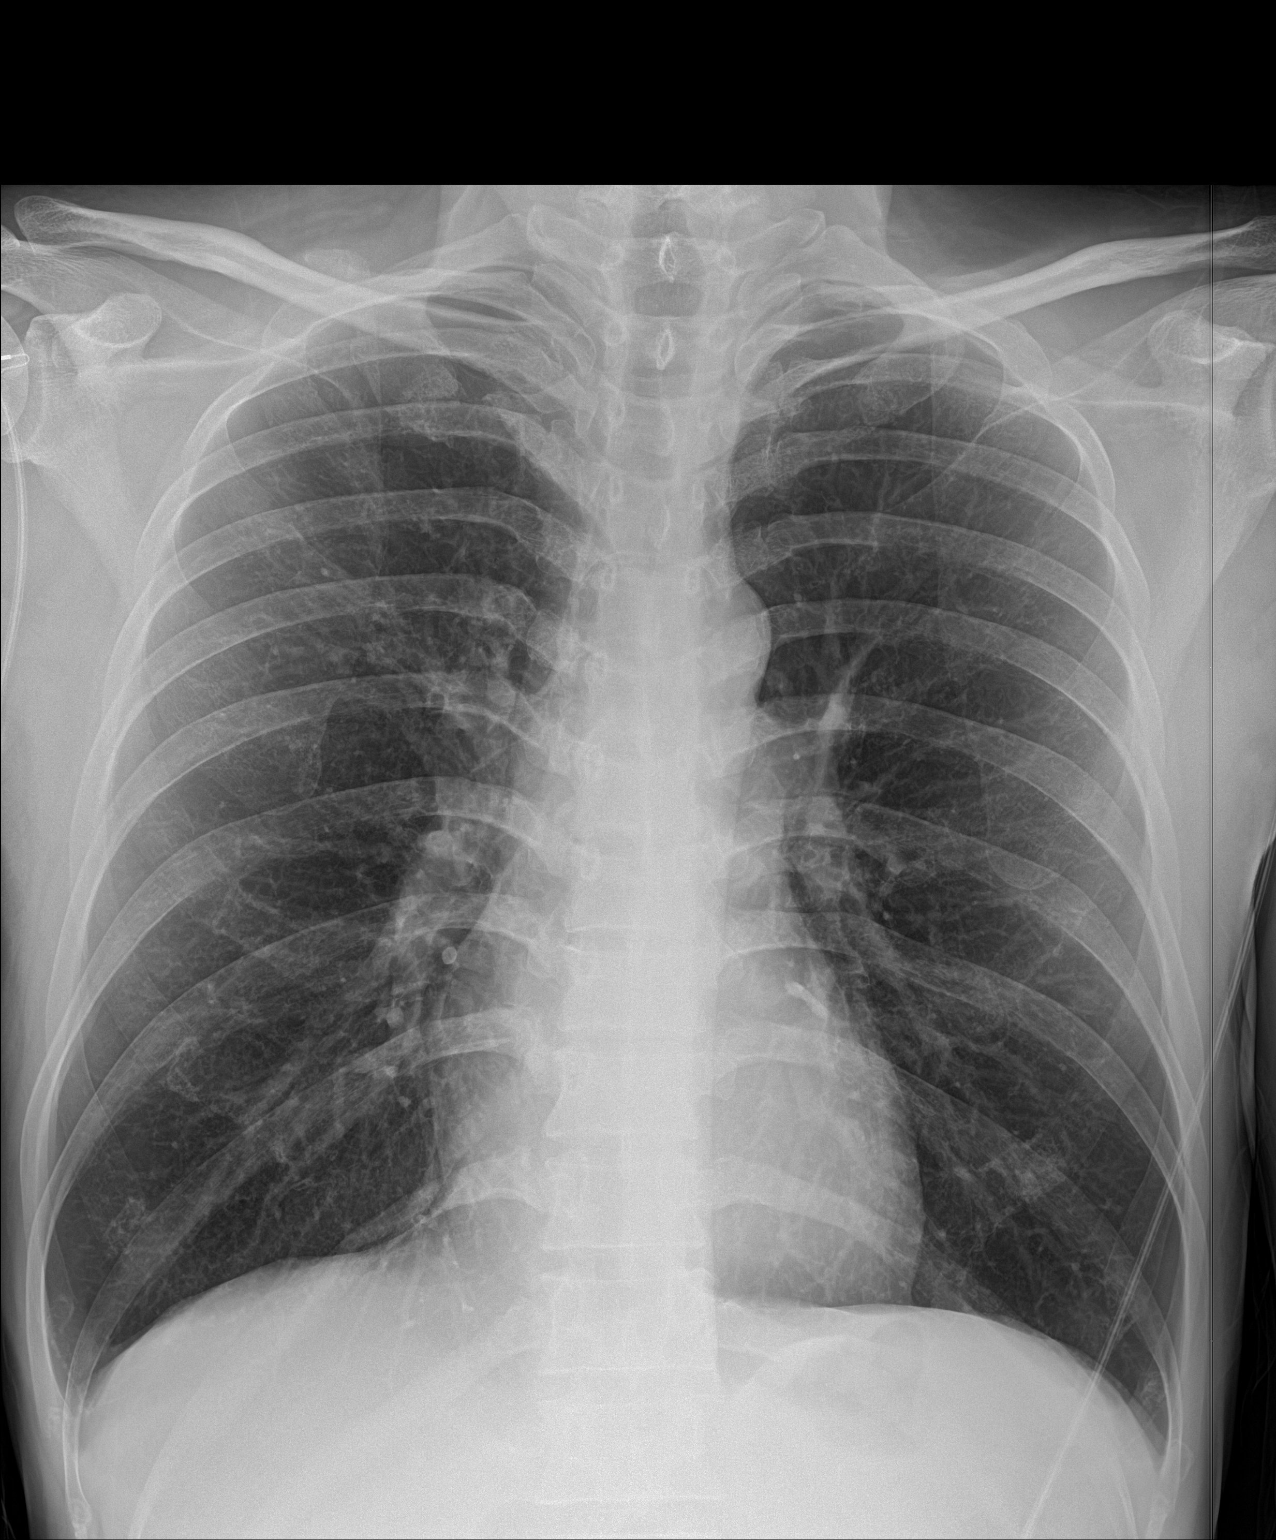

[2 of 2 positions shown; findings below may reference images not displayed]

FINDINGS: The heart size and mediastinal contours are within normal limits.
Both lungs are clear. The visualized skeletal structures are
unremarkable.
IMPRESSION: No active cardiopulmonary disease.
